# Patient Record
Sex: Female | Born: 2000 | Race: White | Hispanic: No | Marital: Single | Smoking: Never smoker
Health system: Southern US, Community
[De-identification: ages and names within clinical notes are randomized; demographics above are authoritative.]

---
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
## 2020-03-13 ENCOUNTER — Other Ambulatory Visit: Payer: Self-pay | Admitting: Podiatry

## 2020-03-13 DIAGNOSIS — R5381 Other malaise: Secondary | ICD-10-CM

## 2020-04-07 ENCOUNTER — Other Ambulatory Visit: Payer: 59

## 2020-04-24 ENCOUNTER — Ambulatory Visit
Admission: RE | Admit: 2020-04-24 | Discharge: 2020-04-24 | Disposition: A | Discharge status: Home / Self Care | Payer: 59 | Source: Ambulatory Visit | Attending: Chiropractic Medicine | Admitting: Chiropractic Medicine

## 2020-04-24 ENCOUNTER — Other Ambulatory Visit: Payer: Self-pay | Admitting: Chiropractic Medicine

## 2020-04-24 ENCOUNTER — Other Ambulatory Visit: Payer: Self-pay

## 2020-04-24 DIAGNOSIS — R52 Pain, unspecified: Secondary | ICD-10-CM

## 2020-05-09 ENCOUNTER — Other Ambulatory Visit: Payer: Self-pay | Admitting: Chiropractic Medicine

## 2020-05-09 ENCOUNTER — Other Ambulatory Visit: Payer: Self-pay

## 2020-05-09 ENCOUNTER — Ambulatory Visit
Admission: RE | Admit: 2020-05-09 | Discharge: 2020-05-09 | Disposition: A | Discharge status: Home / Self Care | Payer: 59 | Source: Ambulatory Visit | Attending: Chiropractic Medicine | Admitting: Chiropractic Medicine

## 2020-05-09 DIAGNOSIS — M419 Scoliosis, unspecified: Secondary | ICD-10-CM

## 2020-05-09 DIAGNOSIS — M549 Dorsalgia, unspecified: Secondary | ICD-10-CM

## 2020-10-25 ENCOUNTER — Ambulatory Visit (INDEPENDENT_AMBULATORY_CARE_PROVIDER_SITE_OTHER): Payer: 59 | Admitting: Nurse Practitioner

## 2020-10-25 ENCOUNTER — Other Ambulatory Visit: Payer: Self-pay

## 2020-10-25 ENCOUNTER — Encounter: Payer: Self-pay | Admitting: Nurse Practitioner

## 2020-10-25 VITALS — BP 112/76 | Ht 65.0 in | Wt 120.8 lb

## 2020-10-25 DIAGNOSIS — N946 Dysmenorrhea, unspecified: Secondary | ICD-10-CM | POA: Diagnosis not present

## 2020-10-25 DIAGNOSIS — N920 Excessive and frequent menstruation with regular cycle: Secondary | ICD-10-CM | POA: Diagnosis not present

## 2020-10-25 DIAGNOSIS — N941 Unspecified dyspareunia: Secondary | ICD-10-CM

## 2020-10-25 MED ORDER — NORETHIN ACE-ETH ESTRAD-FE 1-20 MG-MCG PO TABS: 1.0000 | ORAL_TABLET | Freq: Every day | ORAL | 3 refills | Status: AC

## 2020-10-25 NOTE — Progress Notes (Signed)
   Acute Office Visit  Subjective:    Patient ID: Natasha Barker, female    DOB: [birth year], 19 y.o.   MRN: [id number]   HPI 19 y.o. presents as new patient to discuss heavy, painful menses. Cycles are monthly with heavy bleeding for 3 days and severe cramping that requires her to miss work/school. She has had these symptoms since adolescence. LMP 10/08/2020. She is very nervous about hormonal contraception. She also complains of painful intercourse and low libido. She has not been sexually active with boyfriend since 2019 due to this. She cannot remember if the pain was superficial or deep or what it felt like since it has been a while. She does not desire intercourse and does not find pleasure during the act.    Review of Systems  Constitutional: Negative.   Genitourinary: Positive for dyspareunia and menstrual problem. Negative for pelvic pain, vaginal discharge and vaginal pain.       Objective:    Physical Exam Constitutional:      Appearance: Normal appearance.  Genitourinary:    General: Normal vulva.     Vagina: Normal.     Cervix: Normal.     Uterus: Normal.      BP 112/76 (BP Location: Right Arm, Patient Position: Sitting, Cuff Size: Normal)   Ht 5\' 5"  (1.651 m)   Wt 120 lb 12.8 oz (54.8 kg)   LMP 10/08/2020   BMI 20.10 kg/m  Wt Readings from Last 3 Encounters:  10/25/20 120 lb 12.8 oz (54.8 kg)  07/22/16 112 lb (50.8 kg) (32 %, Z= -0.46)*  11/15/15 110 lb 9.6 oz (50.2 kg) (34 %, Z= -0.41)*   * Growth percentiles are based on CDC (Girls, 2-20 Years) data.        Assessment & Plan:   Problem List Items Addressed This Visit    None    Visit Diagnoses    Dysmenorrhea    -  Primary   Relevant Medications   norethindrone-ethinyl estradiol (LOESTRIN FE) 1-20 MG-MCG tablet   Menorrhagia with regular cycle       Relevant Medications   norethindrone-ethinyl estradiol (LOESTRIN FE) 1-20 MG-MCG tablet   Dyspareunia in female          Plan: Educated on  possible causes of heavy, painful menses being endometriosis versus adenomyosis and discussed treatment options to include symptom management with Ibuprofen every 8 hours first 3 days of cycle and hormonal contraception.  We discussed the benefits and the risk of birth control. Contraceptive options were reviewed, including hormonal methods, both combination (pill, patch, vaginal ring) and progesterone-only (pill, Depo Provera and Nexplanon), intrauterine devices (Mirena, [place], [place], and [place]).  She is not sure if she wants to start anything at this time but wants to consider a low dose combination birth control pill.  Educated on proper use and slight risk for blood clots.  For painful intercourse we discussed the possible psychological and physiological causes and ways to help with this.  She is agreeable to plan.    [facility], 12:38 PM 10/25/2020

## 2021-04-11 IMAGING — CR DG LUMBAR SPINE COMPLETE 4+V
5 series · 5 of 5 positions shown · non-contrast
Comparison: None.

CLINICAL DATA: Low back pain

EXAM:
LUMBAR SPINE - COMPLETE 4+ VIEW

[t lumbar spine ap]
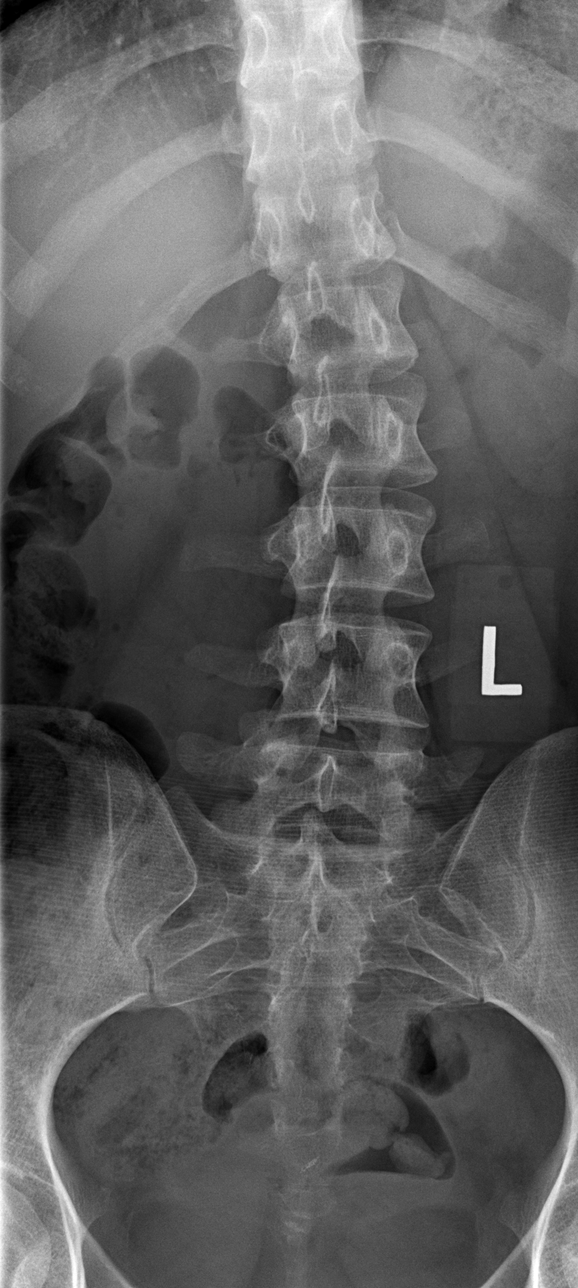

[t lumbar spine obl (1 of 2)]
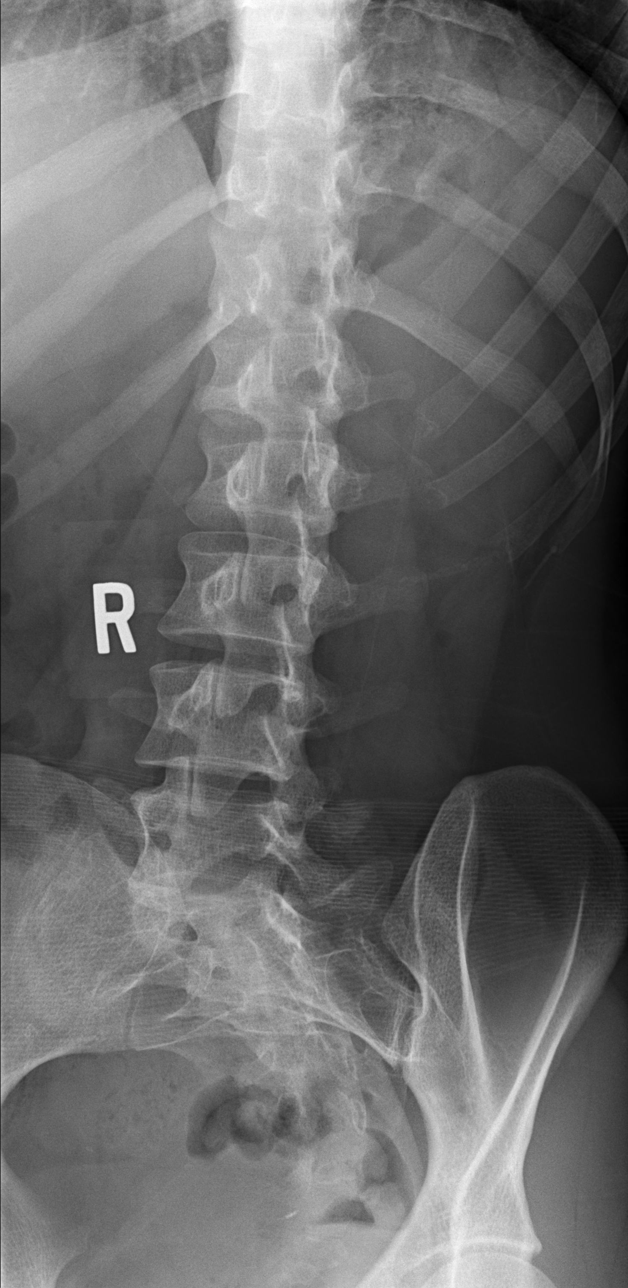

[t lumbar spine obl (2 of 2)]
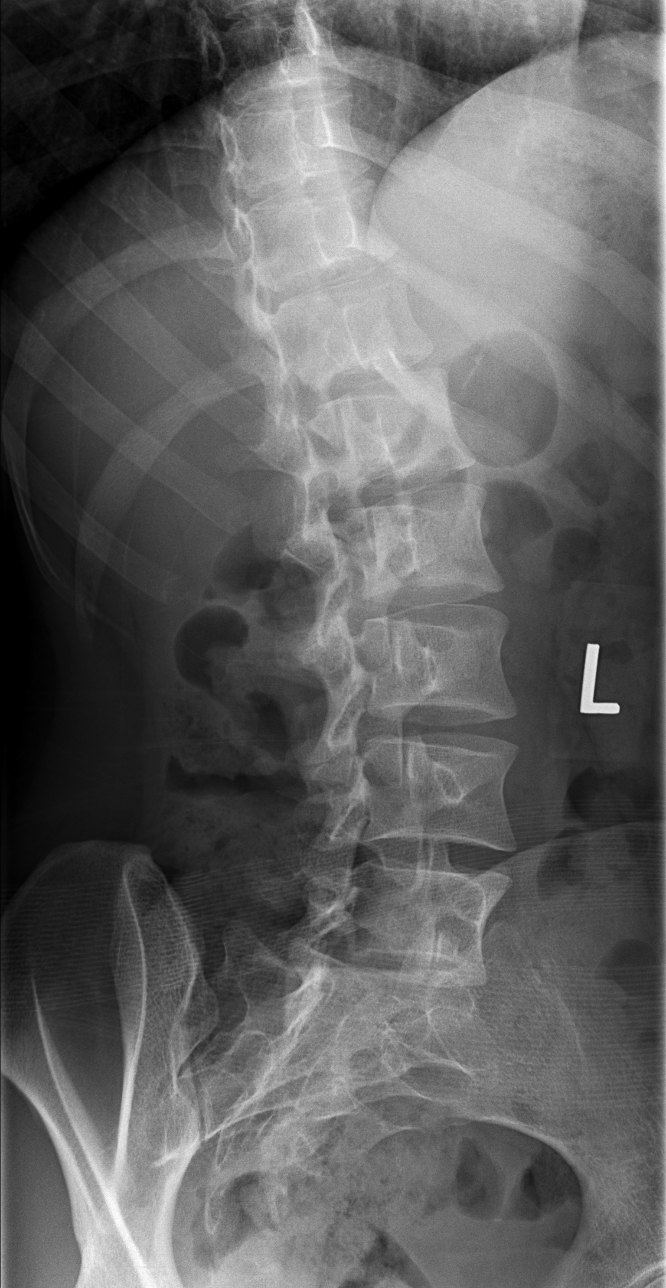

[t lumbar spine lat]
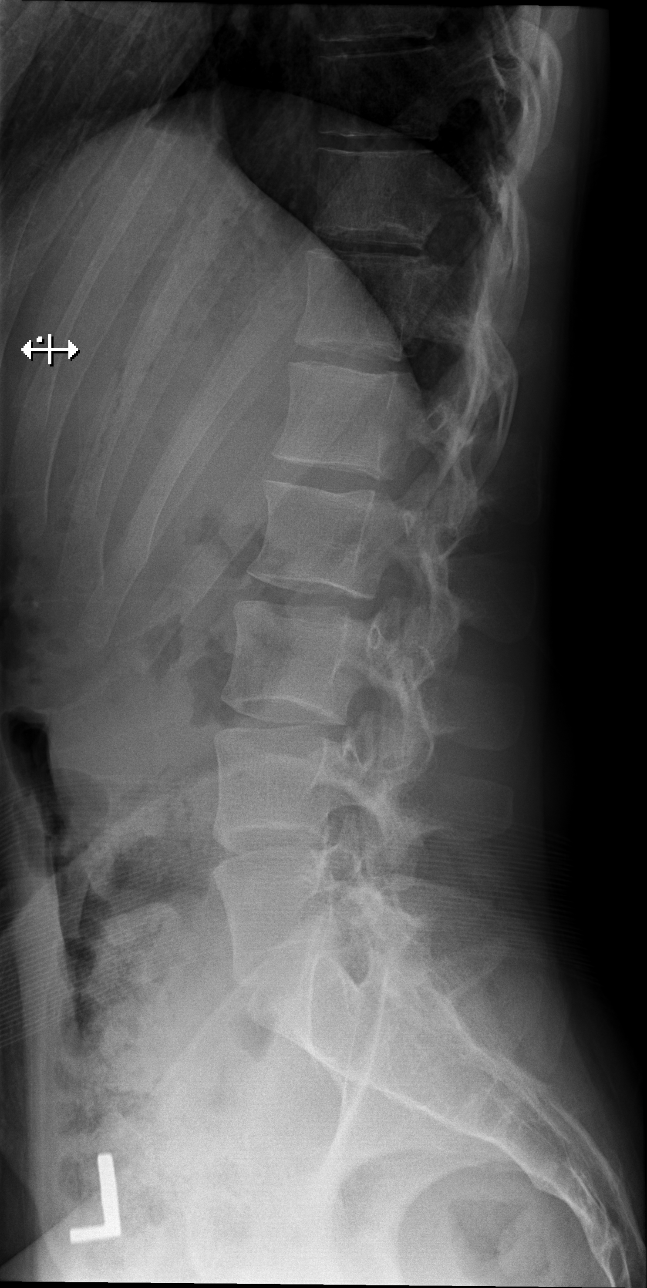

[t lumbar l-5 s-1 spot]
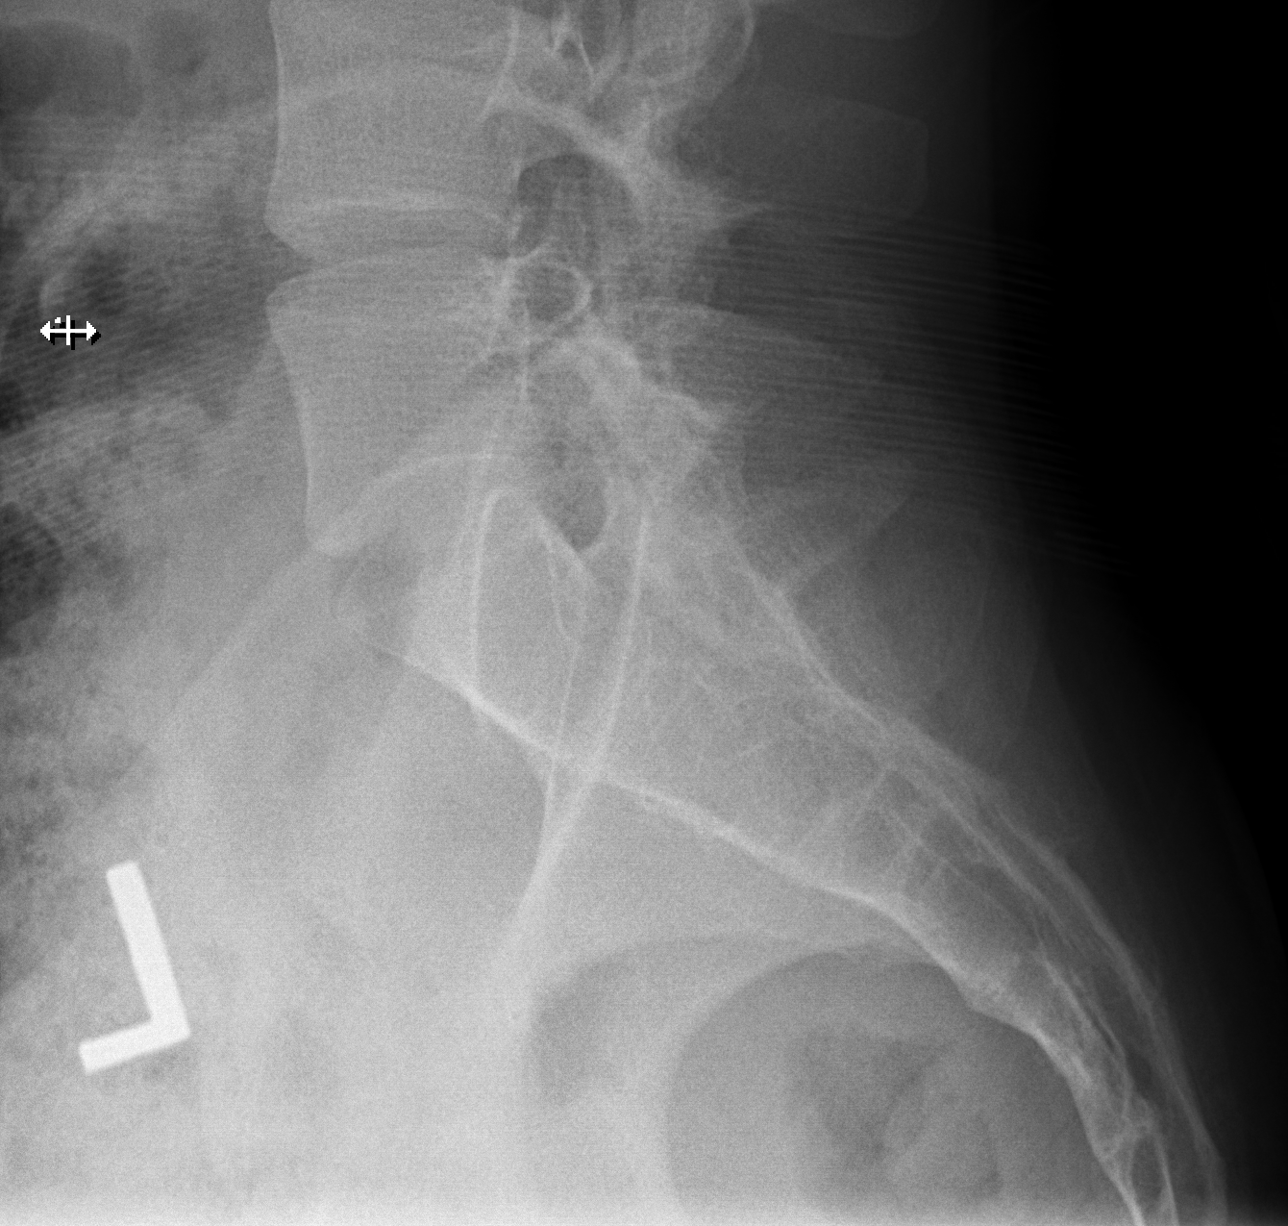

[5 of 5 positions shown; findings below may reference images not displayed]

FINDINGS: Frontal, lateral, spot lumbosacral lateral, and bilateral oblique
views were obtained. There are 5 non-rib-bearing lumbar type
vertebral bodies. There is thoracolumbar levoscoliosis with rotatory
component. There is no fracture or spondylolisthesis. The disc
spaces appear unremarkable. No appreciable facet arthropathy.
IMPRESSION: Scoliosis. No fracture or spondylolisthesis. No appreciable
arthropathy.

## 2022-01-22 DIAGNOSIS — F4322 Adjustment disorder with anxiety: Secondary | ICD-10-CM | POA: Diagnosis not present

## 2022-02-19 DIAGNOSIS — F4322 Adjustment disorder with anxiety: Secondary | ICD-10-CM | POA: Diagnosis not present

## 2022-06-11 DIAGNOSIS — F909 Attention-deficit hyperactivity disorder, unspecified type: Secondary | ICD-10-CM | POA: Diagnosis not present

## 2022-12-12 DIAGNOSIS — F909 Attention-deficit hyperactivity disorder, unspecified type: Secondary | ICD-10-CM | POA: Diagnosis not present

## 2023-01-18 DIAGNOSIS — K529 Noninfective gastroenteritis and colitis, unspecified: Secondary | ICD-10-CM | POA: Diagnosis not present

## 2023-07-09 DIAGNOSIS — L720 Epidermal cyst: Secondary | ICD-10-CM | POA: Diagnosis not present

## 2023-07-09 DIAGNOSIS — L700 Acne vulgaris: Secondary | ICD-10-CM | POA: Diagnosis not present

## 2023-09-04 DIAGNOSIS — L720 Epidermal cyst: Secondary | ICD-10-CM | POA: Diagnosis not present

## 2023-09-04 DIAGNOSIS — L700 Acne vulgaris: Secondary | ICD-10-CM | POA: Diagnosis not present
